# Patient Record
Sex: Male | Born: 1995 | Marital: Single | Smoking: Current some day smoker
Health system: Southern US, Community
[De-identification: ages and names within clinical notes are randomized; demographics above are authoritative.]

---
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
## 2015-08-04 ENCOUNTER — Encounter (HOSPITAL_COMMUNITY): Payer: Self-pay | Admitting: *Deleted

## 2015-08-04 ENCOUNTER — Emergency Department (HOSPITAL_COMMUNITY): Payer: BLUE CROSS/BLUE SHIELD

## 2015-08-04 ENCOUNTER — Observation Stay (HOSPITAL_COMMUNITY)
Admission: EM | Admit: 2015-08-04 | Discharge: 2015-08-05 | Disposition: A | Discharge status: Home / Self Care | Payer: BLUE CROSS/BLUE SHIELD | Attending: General Surgery | Admitting: General Surgery

## 2015-08-04 DIAGNOSIS — S060X9A Concussion with loss of consciousness of unspecified duration, initial encounter: Secondary | ICD-10-CM | POA: Diagnosis not present

## 2015-08-04 DIAGNOSIS — S060XAA Concussion with loss of consciousness status unknown, initial encounter: Secondary | ICD-10-CM | POA: Diagnosis present

## 2015-08-04 DIAGNOSIS — R918 Other nonspecific abnormal finding of lung field: Secondary | ICD-10-CM | POA: Diagnosis not present

## 2015-08-04 DIAGNOSIS — F17200 Nicotine dependence, unspecified, uncomplicated: Secondary | ICD-10-CM | POA: Diagnosis not present

## 2015-08-04 DIAGNOSIS — F10920 Alcohol use, unspecified with intoxication, uncomplicated: Secondary | ICD-10-CM

## 2015-08-04 DIAGNOSIS — F10129 Alcohol abuse with intoxication, unspecified: Secondary | ICD-10-CM | POA: Diagnosis not present

## 2015-08-04 DIAGNOSIS — S0081XA Abrasion of other part of head, initial encounter: Secondary | ICD-10-CM | POA: Diagnosis not present

## 2015-08-04 DIAGNOSIS — M542 Cervicalgia: Secondary | ICD-10-CM | POA: Diagnosis present

## 2015-08-04 DIAGNOSIS — Y92410 Unspecified street and highway as the place of occurrence of the external cause: Secondary | ICD-10-CM | POA: Insufficient documentation

## 2015-08-04 DIAGNOSIS — R102 Pelvic and perineal pain: Secondary | ICD-10-CM | POA: Diagnosis not present

## 2015-08-04 DIAGNOSIS — S27329A Contusion of lung, unspecified, initial encounter: Secondary | ICD-10-CM | POA: Diagnosis present

## 2015-08-04 DIAGNOSIS — F10929 Alcohol use, unspecified with intoxication, unspecified: Secondary | ICD-10-CM | POA: Diagnosis present

## 2015-08-04 DIAGNOSIS — R402431 Glasgow coma scale score 3-8, in the field [EMT or ambulance]: Secondary | ICD-10-CM | POA: Diagnosis not present

## 2015-08-04 DIAGNOSIS — S060X0A Concussion without loss of consciousness, initial encounter: Secondary | ICD-10-CM

## 2015-08-04 LAB — URINALYSIS, ROUTINE W REFLEX MICROSCOPIC
Bilirubin Urine: NEGATIVE
GLUCOSE, UA: NEGATIVE mg/dL
Ketones, ur: NEGATIVE mg/dL
LEUKOCYTES UA: NEGATIVE
Nitrite: NEGATIVE
PROTEIN: NEGATIVE mg/dL
Specific Gravity, Urine: 1.008 (ref 1.005–1.030)
pH: 6 (ref 5.0–8.0)

## 2015-08-04 LAB — I-STAT CHEM 8, ED
BUN: 11 mg/dL (ref 6–20)
CALCIUM ION: 1 mmol/L — AB (ref 1.12–1.23)
CHLORIDE: 108 mmol/L (ref 101–111)
CREATININE: 0.9 mg/dL (ref 0.61–1.24)
GLUCOSE: 81 mg/dL (ref 65–99)
HCT: 51 % (ref 39.0–52.0)
Hemoglobin: 17.3 g/dL — ABNORMAL HIGH (ref 13.0–17.0)
Potassium: 3.8 mmol/L (ref 3.5–5.1)
Sodium: 145 mmol/L (ref 135–145)
TCO2: 20 mmol/L (ref 0–100)

## 2015-08-04 LAB — CBC
HCT: 47.1 % (ref 39.0–52.0)
HEMOGLOBIN: 15.1 g/dL (ref 13.0–17.0)
MCH: 29.5 pg (ref 26.0–34.0)
MCHC: 32.1 g/dL (ref 30.0–36.0)
MCV: 92.2 fL (ref 78.0–100.0)
PLATELETS: 176 10*3/uL (ref 150–400)
RBC: 5.11 MIL/uL (ref 4.22–5.81)
RDW: 12.8 % (ref 11.5–15.5)
WBC: 8.5 10*3/uL (ref 4.0–10.5)

## 2015-08-04 LAB — TYPE AND SCREEN
ABO/RH(D): B POS
ANTIBODY SCREEN: NEGATIVE
UNIT DIVISION: 0
Unit division: 0

## 2015-08-04 LAB — COMPREHENSIVE METABOLIC PANEL
ALK PHOS: 74 U/L (ref 38–126)
ALT: 17 U/L (ref 17–63)
ANION GAP: 15 (ref 5–15)
AST: 32 U/L (ref 15–41)
Albumin: 4.4 g/dL (ref 3.5–5.0)
BILIRUBIN TOTAL: 0.5 mg/dL (ref 0.3–1.2)
BUN: 10 mg/dL (ref 6–20)
CALCIUM: 8.8 mg/dL — AB (ref 8.9–10.3)
CO2: 17 mmol/L — ABNORMAL LOW (ref 22–32)
Chloride: 112 mmol/L — ABNORMAL HIGH (ref 101–111)
Creatinine, Ser: 0.7 mg/dL (ref 0.61–1.24)
Glucose, Bld: 82 mg/dL (ref 65–99)
Potassium: 4 mmol/L (ref 3.5–5.1)
Sodium: 144 mmol/L (ref 135–145)
TOTAL PROTEIN: 7.1 g/dL (ref 6.5–8.1)

## 2015-08-04 LAB — RAPID URINE DRUG SCREEN, HOSP PERFORMED
AMPHETAMINES: NOT DETECTED
BENZODIAZEPINES: NOT DETECTED
Barbiturates: NOT DETECTED
COCAINE: NOT DETECTED
Opiates: NOT DETECTED
Tetrahydrocannabinol: NOT DETECTED

## 2015-08-04 LAB — I-STAT CG4 LACTIC ACID, ED: Lactic Acid, Venous: 2.4 mmol/L (ref 0.5–2.0)

## 2015-08-04 LAB — PREPARE FRESH FROZEN PLASMA
Unit division: 0
Unit division: 0

## 2015-08-04 LAB — CDS SEROLOGY

## 2015-08-04 LAB — ABO/RH: ABO/RH(D): B POS

## 2015-08-04 LAB — URINE MICROSCOPIC-ADD ON
BACTERIA UA: NONE SEEN
SQUAMOUS EPITHELIAL / LPF: NONE SEEN

## 2015-08-04 LAB — PROTIME-INR
INR: 1.01 (ref 0.00–1.49)
PROTHROMBIN TIME: 13.5 s (ref 11.6–15.2)

## 2015-08-04 LAB — ETHANOL: ALCOHOL ETHYL (B): 209 mg/dL — AB (ref <5)

## 2015-08-04 MED ORDER — ENOXAPARIN SODIUM 40 MG/0.4ML ~~LOC~~ SOLN
40.0000 mg | SUBCUTANEOUS | Status: DC
  Administered 2015-08-04: 40 mg via SUBCUTANEOUS
  Filled 2015-08-04 (×2): qty 0.4

## 2015-08-04 MED ORDER — SODIUM CHLORIDE 0.9% FLUSH: 3.0000 mL | INTRAVENOUS | Status: DC | PRN

## 2015-08-04 MED ORDER — ONDANSETRON HCL 4 MG/2ML IJ SOLN
4.0000 mg | Freq: Once | INTRAMUSCULAR | Status: AC
Start: 2015-08-04 — End: 2015-08-04
  Administered 2015-08-04: 4 mg via INTRAVENOUS

## 2015-08-04 MED ORDER — ONDANSETRON HCL 4 MG/2ML IJ SOLN: 4.0000 mg | Freq: Four times a day (QID) | INTRAMUSCULAR | Status: DC | PRN

## 2015-08-04 MED ORDER — TRAMADOL HCL 50 MG PO TABS
50.0000 mg | ORAL_TABLET | Freq: Four times a day (QID) | ORAL | Status: DC | PRN
  Administered 2015-08-04 (×2): 50 mg via ORAL
  Administered 2015-08-05 (×2): 100 mg via ORAL
  Filled 2015-08-04: qty 2
  Filled 2015-08-04 (×2): qty 1
  Filled 2015-08-04: qty 2

## 2015-08-04 MED ORDER — ONDANSETRON HCL 4 MG PO TABS: 4.0000 mg | ORAL_TABLET | Freq: Four times a day (QID) | ORAL | Status: DC | PRN

## 2015-08-04 MED ORDER — IOPAMIDOL (ISOVUE-300) INJECTION 61%
INTRAVENOUS | Status: AC
  Filled 2015-08-04: qty 100

## 2015-08-04 MED ORDER — MORPHINE SULFATE (PF) 2 MG/ML IV SOLN
2.0000 mg | INTRAVENOUS | Status: DC | PRN
Start: 2015-08-04 — End: 2015-08-05
  Administered 2015-08-04: 2 mg via INTRAVENOUS
  Filled 2015-08-04: qty 1

## 2015-08-04 MED ORDER — ONDANSETRON HCL 4 MG/2ML IJ SOLN
INTRAMUSCULAR | Status: AC
  Filled 2015-08-04: qty 2

## 2015-08-04 MED ORDER — BACITRACIN ZINC 500 UNIT/GM EX OINT
TOPICAL_OINTMENT | Freq: Two times a day (BID) | CUTANEOUS | Status: DC
  Administered 2015-08-04: 17:00:00 via TOPICAL
  Administered 2015-08-04: 1 via TOPICAL
  Administered 2015-08-05: 09:00:00 via TOPICAL
  Filled 2015-08-04: qty 28.35

## 2015-08-04 MED ORDER — SODIUM CHLORIDE 0.9% FLUSH
3.0000 mL | Freq: Two times a day (BID) | INTRAVENOUS | Status: DC
  Administered 2015-08-04: 3 mL via INTRAVENOUS

## 2015-08-04 MED ORDER — SODIUM CHLORIDE 0.9 % IV BOLUS (SEPSIS): 1000.0000 mL | Freq: Once | INTRAVENOUS | Status: DC

## 2015-08-04 MED ORDER — SODIUM CHLORIDE 0.9 % IV SOLN: 250.0000 mL | INTRAVENOUS | Status: DC | PRN
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
## 2015-08-04 NOTE — ED Notes (Signed)
Report called to 5m 

## 2015-08-04 NOTE — ED Notes (Signed)
Pt transported to CT with RN Craig Guess.
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
## 2015-08-04 NOTE — ED Notes (Signed)
Chaplain in dept. Going to meet with family now.
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
## 2015-08-04 NOTE — ED Notes (Signed)
Blood arrived @ 0847. Handed to [employer] M, RN.

## 2015-08-04 NOTE — ED Notes (Signed)
Chaplain paged @ [phone].
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
## 2015-08-04 NOTE — Progress Notes (Signed)
PT Cancellation Note  Patient Details Name: Charles Haas MRN: [id number] DOB: [birth year]   Cancelled Treatment:    Reason Eval/Treat Not Completed: Fatigue/lethargy limiting ability to participate; RN reports pt solement and difficult to rouse.  Will attempt to see in AM.   Reginia Naas 08/04/2015, 2:08 PM  Magda Kiel, [place] 08/04/2015

## 2015-08-04 NOTE — Progress Notes (Signed)
Patient arrived to 5M15 noted somnolent, able to be arouse with pain and can to follow commands. Family at bedside. Safety precautions and orders reviewed with patient/family. VSS. He denied any pain or discomfort. No other distress noted. Will continue to monitor.  Ave Filter, RN

## 2015-08-05 DIAGNOSIS — F10929 Alcohol use, unspecified with intoxication, unspecified: Secondary | ICD-10-CM | POA: Diagnosis present

## 2015-08-05 LAB — BLOOD PRODUCT ORDER (VERBAL) VERIFICATION

## 2015-08-05 MED ORDER — TRAMADOL HCL 50 MG PO TABS: 50.0000 mg | ORAL_TABLET | Freq: Four times a day (QID) | ORAL | Status: AC | PRN
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
## 2015-08-05 NOTE — Progress Notes (Signed)
Discharge instructions reviewed with patient/family. All questions answered at this time. RX given. Pt request to have pain medication before discharge, med given. Transport home by father. All belongings sent with patient/family.    Ave Filter, RN

## 2015-08-05 NOTE — Discharge Instructions (Signed)
No driving until symptom-free for 3 days (i.e. No headaches, dizziness, nausea, or visual disturbances)

## 2015-08-08 ENCOUNTER — Encounter (HOSPITAL_BASED_OUTPATIENT_CLINIC_OR_DEPARTMENT_OTHER): Payer: Self-pay | Admitting: General Surgery

## 2018-01-20 ENCOUNTER — Other Ambulatory Visit: Payer: Self-pay

## 2018-01-20 ENCOUNTER — Emergency Department: Payer: BLUE CROSS/BLUE SHIELD

## 2018-01-20 ENCOUNTER — Emergency Department
Admission: EM | Admit: 2018-01-20 | Discharge: 2018-01-20 | Disposition: A | Discharge status: Home / Self Care | Payer: BLUE CROSS/BLUE SHIELD | Attending: Emergency Medicine | Admitting: Emergency Medicine

## 2018-01-20 ENCOUNTER — Encounter: Payer: Self-pay | Admitting: Emergency Medicine

## 2018-01-20 DIAGNOSIS — F17210 Nicotine dependence, cigarettes, uncomplicated: Secondary | ICD-10-CM | POA: Diagnosis not present

## 2018-01-20 DIAGNOSIS — R1010 Upper abdominal pain, unspecified: Secondary | ICD-10-CM | POA: Diagnosis not present

## 2018-01-20 DIAGNOSIS — A0472 Enterocolitis due to Clostridium difficile, not specified as recurrent: Secondary | ICD-10-CM | POA: Insufficient documentation

## 2018-01-20 DIAGNOSIS — Z79899 Other long term (current) drug therapy: Secondary | ICD-10-CM | POA: Insufficient documentation

## 2018-01-20 LAB — GASTROINTESTINAL PANEL BY PCR, STOOL (REPLACES STOOL CULTURE)
ASTROVIRUS: NOT DETECTED
Adenovirus F40/41: NOT DETECTED
CAMPYLOBACTER SPECIES: NOT DETECTED
CRYPTOSPORIDIUM: NOT DETECTED
CYCLOSPORA CAYETANENSIS: NOT DETECTED
ENTEROPATHOGENIC E COLI (EPEC): NOT DETECTED
ENTEROTOXIGENIC E COLI (ETEC): NOT DETECTED
Entamoeba histolytica: NOT DETECTED
Enteroaggregative E coli (EAEC): NOT DETECTED
Giardia lamblia: NOT DETECTED
Norovirus GI/GII: DETECTED — AB
PLESIMONAS SHIGELLOIDES: NOT DETECTED
ROTAVIRUS A: NOT DETECTED
SHIGA LIKE TOXIN PRODUCING E COLI (STEC): NOT DETECTED
Salmonella species: NOT DETECTED
Sapovirus (I, II, IV, and V): NOT DETECTED
Shigella/Enteroinvasive E coli (EIEC): NOT DETECTED
VIBRIO SPECIES: NOT DETECTED
Vibrio cholerae: NOT DETECTED
Yersinia enterocolitica: NOT DETECTED

## 2018-01-20 LAB — CBC
HCT: 50.5 % (ref 39.0–52.0)
Hemoglobin: 16.4 g/dL (ref 13.0–17.0)
MCH: 29.3 pg (ref 26.0–34.0)
MCHC: 32.5 g/dL (ref 30.0–36.0)
MCV: 90.2 fL (ref 80.0–100.0)
Platelets: 278 10*3/uL (ref 150–400)
RBC: 5.6 MIL/uL (ref 4.22–5.81)
RDW: 12.4 % (ref 11.5–15.5)
WBC: 16.8 10*3/uL — AB (ref 4.0–10.5)
nRBC: 0 % (ref 0.0–0.2)

## 2018-01-20 LAB — URINALYSIS, COMPLETE (UACMP) WITH MICROSCOPIC
Bacteria, UA: NONE SEEN
Bilirubin Urine: NEGATIVE
Glucose, UA: NEGATIVE mg/dL
Ketones, ur: 5 mg/dL — AB
Leukocytes, UA: NEGATIVE
NITRITE: NEGATIVE
Protein, ur: NEGATIVE mg/dL
Specific Gravity, Urine: 1.029 (ref 1.005–1.030)
Squamous Epithelial / LPF: NONE SEEN (ref 0–5)
pH: 5 (ref 5.0–8.0)

## 2018-01-20 LAB — COMPREHENSIVE METABOLIC PANEL
ALK PHOS: 67 U/L (ref 38–126)
ALT: 18 U/L (ref 0–44)
AST: 23 U/L (ref 15–41)
Albumin: 4.8 g/dL (ref 3.5–5.0)
Anion gap: 9 (ref 5–15)
BILIRUBIN TOTAL: 0.6 mg/dL (ref 0.3–1.2)
BUN: 17 mg/dL (ref 6–20)
CALCIUM: 9.2 mg/dL (ref 8.9–10.3)
CO2: 25 mmol/L (ref 22–32)
Chloride: 106 mmol/L (ref 98–111)
Creatinine, Ser: 0.81 mg/dL (ref 0.61–1.24)
GFR calc Af Amer: >60 mL/min (ref >60)
Glucose, Bld: 99 mg/dL (ref 70–99)
Potassium: 4.4 mmol/L (ref 3.5–5.1)
Sodium: 140 mmol/L (ref 135–145)
TOTAL PROTEIN: 7.9 g/dL (ref 6.5–8.1)

## 2018-01-20 LAB — LIPASE, BLOOD: Lipase: 42 U/L (ref 11–51)

## 2018-01-20 LAB — C DIFFICILE QUICK SCREEN W PCR REFLEX
C DIFFICLE (CDIFF) ANTIGEN: POSITIVE — AB
C Diff toxin: NEGATIVE

## 2018-01-20 LAB — CLOSTRIDIUM DIFFICILE BY PCR, REFLEXED: Toxigenic C. Difficile by PCR: NEGATIVE

## 2018-01-20 MED ORDER — METRONIDAZOLE 500 MG PO TABS
500.0000 mg | ORAL_TABLET | Freq: Once | ORAL | Status: AC
  Administered 2018-01-20: 500 mg via ORAL
  Filled 2018-01-20: qty 1

## 2018-01-20 MED ORDER — ONDANSETRON HCL 4 MG PO TABS: 4.0000 mg | ORAL_TABLET | Freq: Every day | ORAL | 0 refills | Status: AC | PRN

## 2018-01-20 MED ORDER — SODIUM CHLORIDE 0.9 % IV BOLUS
1000.0000 mL | Freq: Once | INTRAVENOUS | Status: AC
  Administered 2018-01-20: 1000 mL via INTRAVENOUS

## 2018-01-20 MED ORDER — ONDANSETRON HCL 4 MG/2ML IJ SOLN
4.0000 mg | Freq: Once | INTRAMUSCULAR | Status: AC
  Administered 2018-01-20: 4 mg via INTRAVENOUS
  Filled 2018-01-20: qty 2

## 2018-01-20 MED ORDER — IOPAMIDOL (ISOVUE-300) INJECTION 61%
30.0000 mL | Freq: Once | INTRAVENOUS | Status: AC | PRN
  Administered 2018-01-20: 30 mL via ORAL
  Filled 2018-01-20: qty 30

## 2018-01-20 MED ORDER — METRONIDAZOLE 500 MG PO TABS: 500.0000 mg | ORAL_TABLET | Freq: Three times a day (TID) | ORAL | 0 refills | Status: AC

## 2018-01-20 MED ORDER — IOPAMIDOL (ISOVUE-300) INJECTION 61%
100.0000 mL | Freq: Once | INTRAVENOUS | Status: AC | PRN
  Administered 2018-01-20: 100 mL via INTRAVENOUS
  Filled 2018-01-20: qty 100

## 2018-01-20 NOTE — ED Triage Notes (Addendum)
Pt arrived via POV with reports of upper  abdominal pain for the past couple of weeks, states the pain feels like bloating and discomfort.  Pt denies any vomiting, but does report some diarrhea and nausea.  Pt has been on omnicef for URI and has not yet completed regimen.    Pt reports some pain with urination as well-burning.  Pt reports stool has been dark green liquid. Pt reports 2 episodes of diarrhea today.

## 2018-01-20 NOTE — ED Notes (Signed)
Pt called in the WR with no response 

## 2018-01-20 NOTE — ED Notes (Signed)
Pt ambulatory to POV without difficulty. VSS. NAD. Discharge instructions, RX and follow up reviewed. All questions and concerns addressed.  
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
## 2018-01-21 ENCOUNTER — Telehealth: Payer: Self-pay | Admitting: Emergency Medicine

## 2018-01-21 NOTE — Telephone Encounter (Addendum)
Called patient to inform of gi panel result.  Reviewed by dr Corky Downs and wants patient instructed to stop the flagyl as he feels he has the norovirus and not c diff at this time.  Called [facility] phone and left message.  Called mobile and there is not an answer.   01/22/2018--I was able to talk to patient today.  Instructed as above.  Says his girlfriend also has diarrhea and has been to her doctor.  Instructed to have her tell her doctor about his norovirus result as well as the 60 month old in the house who also has diarrhea.  Says the baby has appt at 11am and they will inform the doctor.

## 2019-08-14 ENCOUNTER — Emergency Department: Payer: BC Managed Care – PPO

## 2019-08-14 ENCOUNTER — Other Ambulatory Visit: Payer: Self-pay

## 2019-08-14 ENCOUNTER — Emergency Department
Admission: EM | Admit: 2019-08-14 | Discharge: 2019-08-14 | Disposition: A | Discharge status: Home / Self Care | Payer: BC Managed Care – PPO | Attending: Emergency Medicine | Admitting: Emergency Medicine

## 2019-08-14 DIAGNOSIS — F17210 Nicotine dependence, cigarettes, uncomplicated: Secondary | ICD-10-CM | POA: Insufficient documentation

## 2019-08-14 DIAGNOSIS — R1031 Right lower quadrant pain: Secondary | ICD-10-CM | POA: Diagnosis present

## 2019-08-14 DIAGNOSIS — Z79899 Other long term (current) drug therapy: Secondary | ICD-10-CM | POA: Insufficient documentation

## 2019-08-14 DIAGNOSIS — N50811 Right testicular pain: Secondary | ICD-10-CM | POA: Diagnosis not present

## 2019-08-14 DIAGNOSIS — R350 Frequency of micturition: Secondary | ICD-10-CM | POA: Diagnosis not present

## 2019-08-14 LAB — COMPREHENSIVE METABOLIC PANEL
ALT: 21 U/L (ref 0–44)
AST: 28 U/L (ref 15–41)
Albumin: 4.6 g/dL (ref 3.5–5.0)
Alkaline Phosphatase: 65 U/L (ref 38–126)
Anion gap: 9 (ref 5–15)
BUN: 18 mg/dL (ref 6–20)
CO2: 25 mmol/L (ref 22–32)
Calcium: 8.9 mg/dL (ref 8.9–10.3)
Chloride: 102 mmol/L (ref 98–111)
Creatinine, Ser: 0.69 mg/dL (ref 0.61–1.24)
GFR calc Af Amer: >60 mL/min (ref >60)
GFR calc non Af Amer: >60 mL/min (ref >60)
Glucose, Bld: 128 mg/dL — ABNORMAL HIGH (ref 70–99)
Potassium: 3.9 mmol/L (ref 3.5–5.1)
Sodium: 136 mmol/L (ref 135–145)
Total Bilirubin: 0.4 mg/dL (ref 0.3–1.2)
Total Protein: 7.5 g/dL (ref 6.5–8.1)

## 2019-08-14 LAB — LIPASE, BLOOD: Lipase: 55 U/L — ABNORMAL HIGH (ref 11–51)

## 2019-08-14 LAB — CBC
HCT: 41.1 % (ref 39.0–52.0)
Hemoglobin: 13.9 g/dL (ref 13.0–17.0)
MCH: 30 pg (ref 26.0–34.0)
MCHC: 33.8 g/dL (ref 30.0–36.0)
MCV: 88.8 fL (ref 80.0–100.0)
Platelets: 253 10*3/uL (ref 150–400)
RBC: 4.63 MIL/uL (ref 4.22–5.81)
RDW: 11.9 % (ref 11.5–15.5)
WBC: 8.7 10*3/uL (ref 4.0–10.5)
nRBC: 0 % (ref 0.0–0.2)

## 2019-08-14 LAB — URINALYSIS, COMPLETE (UACMP) WITH MICROSCOPIC
Bacteria, UA: NONE SEEN
Bilirubin Urine: NEGATIVE
Glucose, UA: NEGATIVE mg/dL
Ketones, ur: NEGATIVE mg/dL
Leukocytes,Ua: NEGATIVE
Nitrite: NEGATIVE
Protein, ur: NEGATIVE mg/dL
Specific Gravity, Urine: 1.018 (ref 1.005–1.030)
Squamous Epithelial / LPF: NONE SEEN (ref 0–5)
pH: 5 (ref 5.0–8.0)

## 2019-08-14 MED ORDER — KETOROLAC TROMETHAMINE 30 MG/ML IJ SOLN
15.0000 mg | INTRAMUSCULAR | Status: AC
  Administered 2019-08-14: 15 mg via INTRAVENOUS
  Filled 2019-08-14: qty 1

## 2019-08-14 MED ORDER — SODIUM CHLORIDE 0.9 % IV BOLUS
1000.0000 mL | Freq: Once | INTRAVENOUS | Status: AC
  Administered 2019-08-14: 1000 mL via INTRAVENOUS

## 2019-08-14 MED ORDER — IOHEXOL 300 MG/ML  SOLN
100.0000 mL | Freq: Once | INTRAMUSCULAR | Status: AC | PRN
  Administered 2019-08-14: 100 mL via INTRAVENOUS

## 2019-08-14 NOTE — ED Triage Notes (Signed)
RLQ pain and right testicle pain, urinary frequency since Monday.
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
## 2019-08-14 NOTE — Discharge Instructions (Signed)
Your labs and ultrasound today were unremarkable.  Your CT scan shows some mild inflammation of the spermatic cord of the right testicle.  We discussed this finding with urology who recommend anti-inflammatory medication such as ibuprofen or naproxen and supportive underwear. If symptoms do not resolve in the next few days, please call the urology clinic for a follow up appointment.

## 2020-01-07 ENCOUNTER — Ambulatory Visit
Admission: RE | Admit: 2020-01-07 | Discharge: 2020-01-07 | Disposition: A | Discharge status: Home / Self Care | Payer: BC Managed Care – PPO | Source: Ambulatory Visit | Attending: Family Medicine | Admitting: Family Medicine

## 2020-01-07 VITALS — BP 117/75 | HR 90 | Temp 99.4°F | Resp 18

## 2020-01-07 DIAGNOSIS — J209 Acute bronchitis, unspecified: Secondary | ICD-10-CM

## 2020-01-07 DIAGNOSIS — J069 Acute upper respiratory infection, unspecified: Secondary | ICD-10-CM | POA: Diagnosis not present

## 2020-01-07 MED ORDER — AZITHROMYCIN 250 MG PO TABS
250.0000 mg | ORAL_TABLET | Freq: Every day | ORAL | 0 refills | Status: AC
Start: 2020-01-07 — End: ?

## 2020-01-07 MED ORDER — BENZONATATE 100 MG PO CAPS: 100.0000 mg | ORAL_CAPSULE | Freq: Three times a day (TID) | ORAL | 0 refills | Status: AC

## 2020-01-07 MED ORDER — PREDNISONE 10 MG (21) PO TBPK: ORAL_TABLET | Freq: Every day | ORAL | 0 refills | Status: AC

## 2020-01-07 MED ORDER — ALBUTEROL SULFATE HFA 108 (90 BASE) MCG/ACT IN AERS
2.0000 | INHALATION_SPRAY | RESPIRATORY_TRACT | 0 refills | Status: AC | PRN
Start: 2020-01-07 — End: ?

## 2020-01-07 NOTE — Discharge Instructions (Signed)
I have sent in a prednisone taper for you to take for 6 days. 6 tablets on day one, 5 tablets on day two, 4 tablets on day three, 3 tablets on day four, 2 tablets on day five, and 1 tablet on day six.  I have sent in azithromycin for you to take.  Take 2 tablets today, and then take 1 tablet daily for the next 4 days.  I have sent in an albuterol inhaler for you to use 2 puffs every 4-6 hours as needed for wheezing, shortness of breath, cough.  I have sent in [facility] for you to use 1 capsule every 8 hours as needed for cough.  Follow-up with this office or with primary care if symptoms or not improving by Monday.  Follow-up with the ER for trouble swallowing, high fever, trouble breathing, other concerning symptoms

## 2020-01-07 NOTE — ED Triage Notes (Signed)
Pt presents with productive cough x 1 month.  Green-yellow mucous.  Denies fevers, SOB.   No other symptoms.  Was initially using Dayquil and Robitussin.  Had some improvement but cough worsened yesterday.  Chest hurts when he coughs.

## 2020-01-07 NOTE — ED Provider Notes (Signed)
[place]   [id number] 01/07/20 Arrival Time: 1509   SUBJECTIVE:  Charles Haas is a 24 y.o. male who presents with complaint of nasal congestion, post-nasal drainage, and a persistent cough. Onset abrupt approximately 4 weeks ago. Overall fatigued. SOB: mild. Wheezing: moderate. Has negative history of Covid. Has not completed Covid vaccines. OTC treatment: none. Current daily smoker. Social History   Tobacco Use  Smoking Status Current Some Day Smoker  . Packs/day: 0.25  . Types: Cigarettes  Smokeless Tobacco Never Used    ROS: As per HPI.   OBJECTIVE:  Vitals:   01/07/20 1523  BP: 117/75  Pulse: 90  Resp: 18  Temp: 99.4 F (37.4 C)  TempSrc: Oral  SpO2: 97%     General appearance: alert; no distress HEENT: nasal congestion; clear runny nose; throat irritation secondary to post-nasal drainage Neck: supple without LAD Lungs: wheezing to bilateral lower lobes Skin: warm and dry Psychological: alert and cooperative; normal mood and affect  Results for orders placed or performed during the hospital encounter of 08/14/19  Lipase, blood  Result Value Ref Range   Lipase 55 (H) 11 - 51 U/L  Comprehensive metabolic panel  Result Value Ref Range   Sodium 136 135 - 145 mmol/L   Potassium 3.9 3.5 - 5.1 mmol/L   Chloride 102 98 - 111 mmol/L   CO2 25 22 - 32 mmol/L   Glucose, Bld 128 (H) 70 - 99 mg/dL   BUN 18 6 - 20 mg/dL   Creatinine, Ser 0.69 0.61 - 1.24 mg/dL   Calcium 8.9 8.9 - 10.3 mg/dL   Total Protein 7.5 6.5 - 8.1 g/dL   Albumin 4.6 3.5 - 5.0 g/dL   AST 28 15 - 41 U/L   ALT 21 0 - 44 U/L   Alkaline Phosphatase 65 38 - 126 U/L   Total Bilirubin 0.4 0.3 - 1.2 mg/dL   GFR calc non Af Amer >60 >60 mL/min   GFR calc Af Amer >60 >60 mL/min   Anion gap 9 5 - 15  CBC  Result Value Ref Range   WBC 8.7 4.0 - 10.5 K/uL   RBC 4.63 4.22 - 5.81 MIL/uL   Hemoglobin 13.9 13.0 - 17.0 g/dL   HCT 41.1 39 - 52 %   MCV 88.8 80.0 - 100.0 fL   MCH 30.0 26.0 -  34.0 pg   MCHC 33.8 30.0 - 36.0 g/dL   RDW 11.9 11.5 - 15.5 %   Platelets 253 150 - 400 K/uL   nRBC 0.0 0.0 - 0.2 %  Urinalysis, Complete w Microscopic  Result Value Ref Range   Color, Urine YELLOW (A) YELLOW   APPearance CLEAR (A) CLEAR   Specific Gravity, Urine 1.018 1.005 - 1.030   pH 5.0 5.0 - 8.0   Glucose, UA NEGATIVE NEGATIVE mg/dL   Hgb urine dipstick SMALL (A) NEGATIVE   Bilirubin Urine NEGATIVE NEGATIVE   Ketones, ur NEGATIVE NEGATIVE mg/dL   Protein, ur NEGATIVE NEGATIVE mg/dL   Nitrite NEGATIVE NEGATIVE   Leukocytes,Ua NEGATIVE NEGATIVE   RBC / HPF 0-5 0 - 5 RBC/hpf   WBC, UA 0-5 0 - 5 WBC/hpf   Bacteria, UA NONE SEEN NONE SEEN   Squamous Epithelial / LPF NONE SEEN 0 - 5   Mucus PRESENT     Labs Reviewed - No data to display  No results found.  No Known Allergies  History reviewed. No pertinent past medical history. Social History   Socioeconomic History  .  Marital status: Single    Spouse name: Not on file  . Number of children: Not on file  . Years of education: Not on file  . Highest education level: Not on file  Occupational History  . Not on file  Tobacco Use  . Smoking status: Current Some Day Smoker    Packs/day: 0.25    Types: Cigarettes  . Smokeless tobacco: Never Used  Vaping Use  . Vaping Use: Some days  . Substances: Nicotine  Substance and Sexual Activity  . Alcohol use: Yes    Comment: occasional  . Drug use: No  . Sexual activity: Never  Other Topics Concern  . Not on file  Social History Narrative   ** Merged History Encounter **       Social Determinants of Health   Financial Resource Strain:   . Difficulty of Paying Living Expenses: Not on file  Food Insecurity:   . Worried About Charity fundraiser in the Last Year: Not on file  . Ran Out of Food in the Last Year: Not on file  Transportation Needs:   . Lack of Transportation (Medical): Not on file  . Lack of Transportation (Non-Medical): Not on file  Physical  Activity:   . Days of Exercise per Week: Not on file  . Minutes of Exercise per Session: Not on file  Stress:   . Feeling of Stress : Not on file  Social Connections:   . Frequency of Communication with Friends and Family: Not on file  . Frequency of Social Gatherings with Friends and Family: Not on file  . Attends Religious Services: Not on file  . Active Member of Clubs or Organizations: Not on file  . Attends Archivist Meetings: Not on file  . Marital Status: Not on file  Intimate Partner Violence:   . Fear of Current or Ex-Partner: Not on file  . Emotionally Abused: Not on file  . Physically Abused: Not on file  . Sexually Abused: Not on file   Family History  Problem Relation Age of Onset  . Hypertension Maternal Grandmother   . Heart disease Maternal Grandmother   . Cancer Maternal Grandmother   . Arthritis Maternal Grandmother   . Hypertension Maternal Grandfather   . Heart disease Maternal Grandfather   . Diabetes Maternal Grandfather   . Heart disease Paternal Grandmother   . Hypertension Paternal Grandmother   . Mental illness Paternal Grandmother   . COPD Paternal Grandmother   . Arthritis Paternal Grandmother   . Heart disease Paternal Grandfather   . Hypertension Paternal Grandfather   . Alcohol abuse Paternal Grandfather   . Healthy Mother   . Healthy Father      ASSESSMENT & PLAN:  1. Upper respiratory tract infection, unspecified type   2. Acute bronchitis, unspecified organism     Meds ordered this encounter  Medications  . azithromycin (ZITHROMAX) 250 MG tablet    Sig: Take 1 tablet (250 mg total) by mouth daily. Take first 2 tablets together, then 1 every day until finished.    Dispense:  6 tablet    Refill:  0    Order Specific Question:   Supervising Provider    Answer:   Chase Picket [id number]  . benzonatate (TESSALON) 100 MG capsule    Sig: Take 1 capsule (100 mg total) by mouth every 8 (eight) hours.    Dispense:  21  capsule    Refill:  0    Order Specific  Question:   Supervising Provider    Answer:   Chase Picket [id number]  . predniSONE (STERAPRED UNI-PAK 21 TAB) 10 MG (21) TBPK tablet    Sig: Take by mouth daily for 6 days. Take 6 tablets on day 1, 5 tablets on day 2, 4 tablets on day 3, 3 tablets on day 4, 2 tablets on day 5, 1 tablet on day 6    Dispense:  21 tablet    Refill:  0    Order Specific Question:   Supervising Provider    Answer:   Chase Picket [id number]  . albuterol (VENTOLIN HFA) 108 (90 Base) MCG/ACT inhaler    Sig: Inhale 2 puffs into the lungs every 4 (four) hours as needed for wheezing or shortness of breath.    Dispense:  18 g    Refill:  0    Order Specific Question:   Supervising Provider    Answer:   Chase Picket [id number]   Prescribed albuterol Prescribed steroid taper Prescribed tessalon perles Prescribed azithromycin Work note provided OTC symptom care as needed. Will plan f/u with PCP or here as needed.  Reviewed expectations re: course of current medical issues. Questions answered. Outlined signs and symptoms indicating need for more acute intervention. Patient verbalized understanding. After Visit Summary given.           Faustino Congress, NP 01/07/20 1547

## 2021-02-20 IMAGING — CT CT ABD-PELV W/ CM
2 of 4 series · 16 of 46 positions shown, 18 images · IV contrast (omnipaque)
Comparison: CT 01/20/2018

CLINICAL DATA: RIGHT lower quadrant pain. RIGHT testicular pain
urinary frequency. Normal white blood cell

EXAM:
CT ABDOMEN AND PELVIS WITH CONTRAST
TECHNIQUE: Multidetector CT imaging of the abdomen and pelvis was performed
using the standard protocol following bolus administration of
intravenous contrast.
CONTRAST:  100mL OMNIPAQUE IOHEXOL 300 MG/ML  SOLN

[Series 2: axial st · axial · 0.70mm/px · z∈[-541,-141]mm · 13 of 88 slices shown, 15 images]
[im 4/88  soft-tissue]
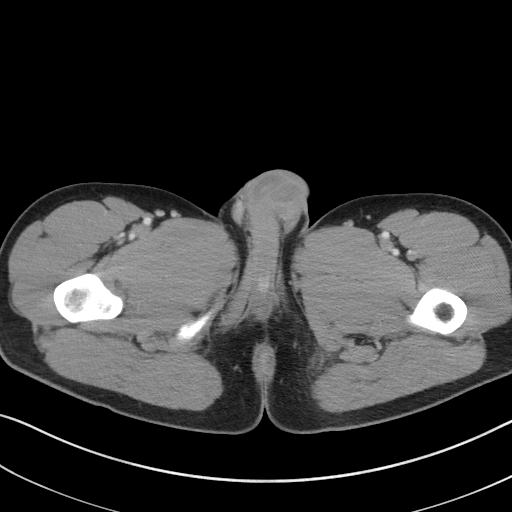
[im 4/88  bone]
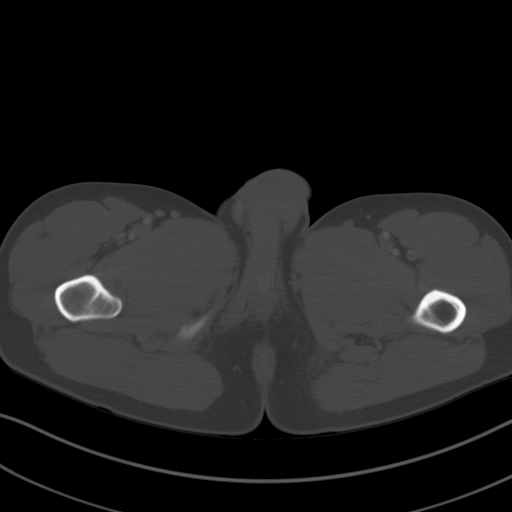
[im 11/88  soft-tissue]
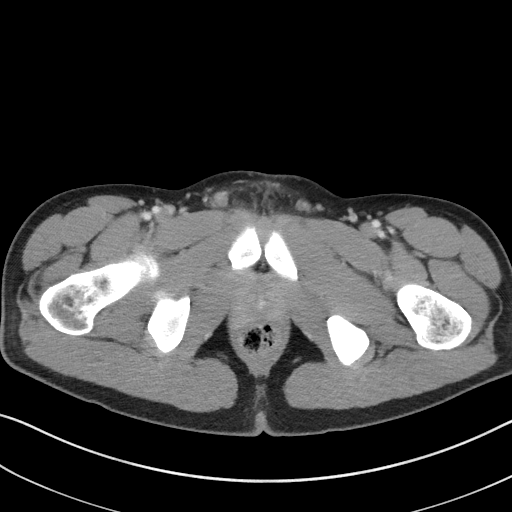
[im 18/88  soft-tissue]
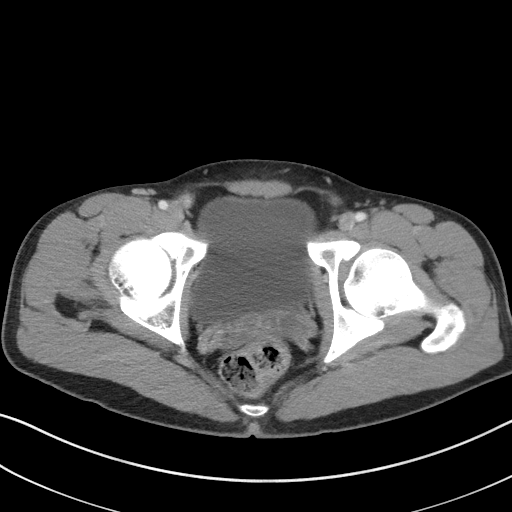
[im 25/88  soft-tissue]
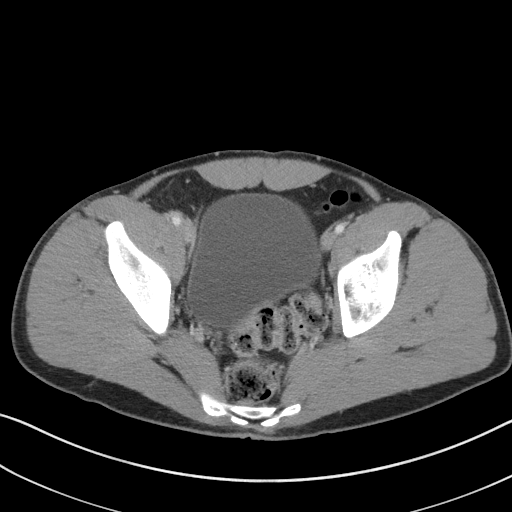
[im 32/88  soft-tissue]
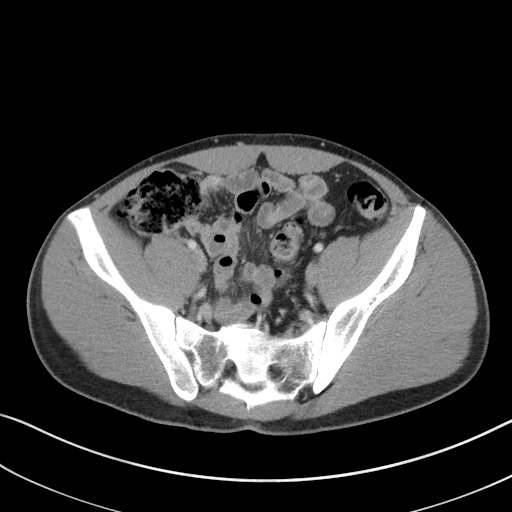
[im 39/88  soft-tissue]
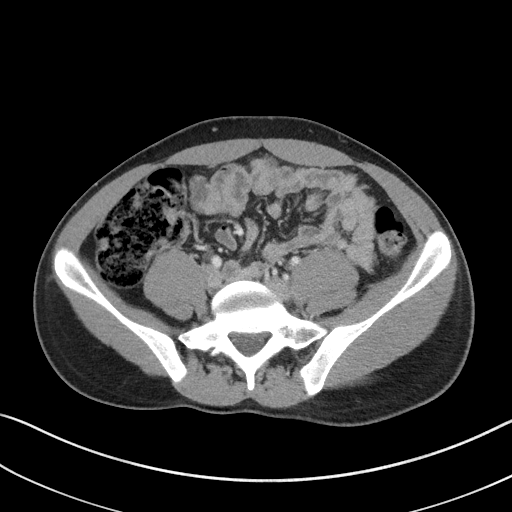
[im 46/88  soft-tissue]
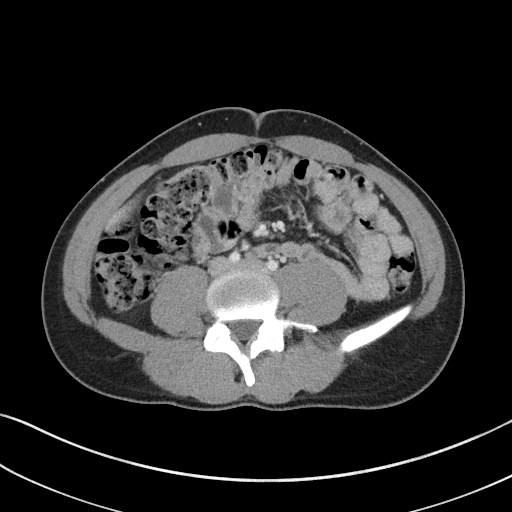
[im 49/88  soft-tissue]
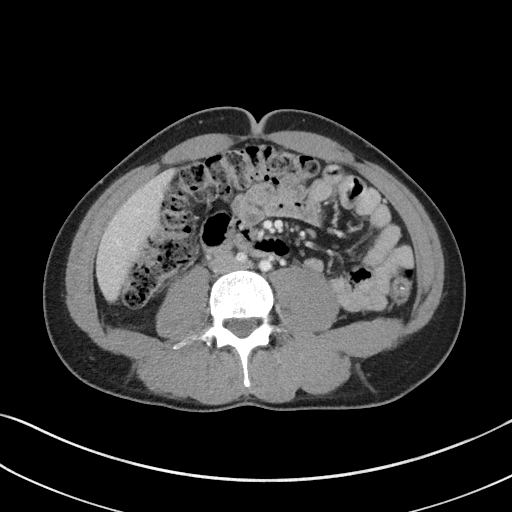
[im 56/88  soft-tissue]
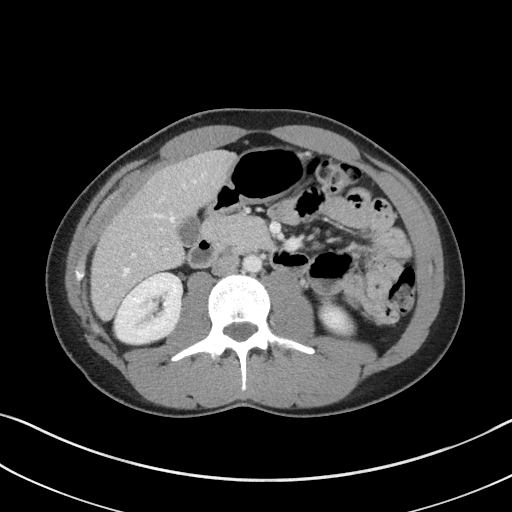
[im 56/88  bone]
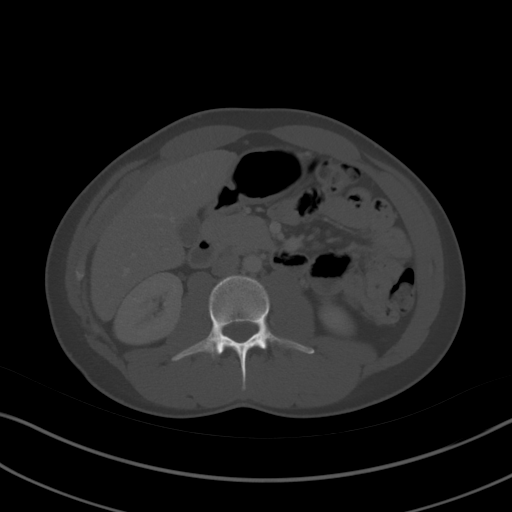
[im 63/88  soft-tissue]
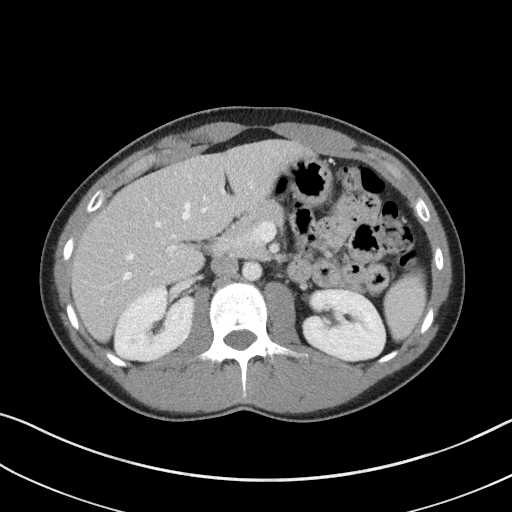
[im 70/88  soft-tissue]
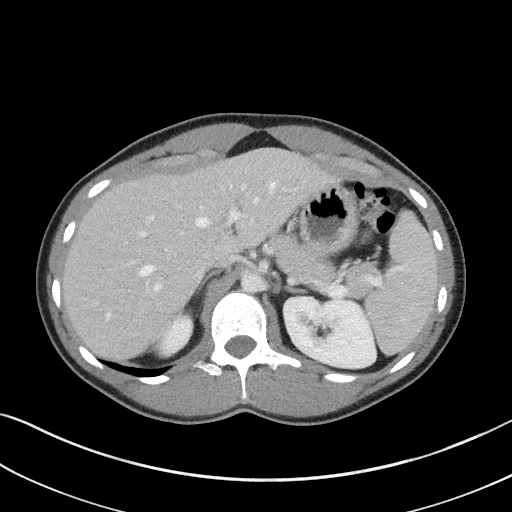
[im 77/88  soft-tissue]
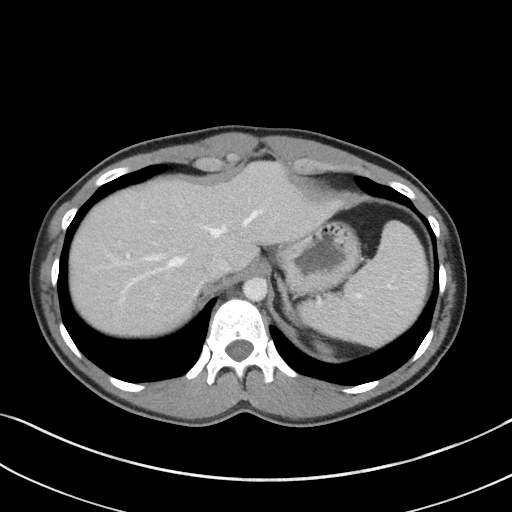
[im 84/88  soft-tissue]
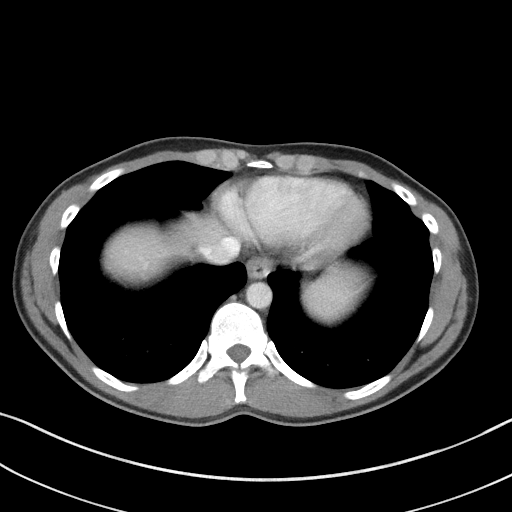

[Series 5: coronal st · coronal · 0.74mm/px · 3 of 79 slices shown]
[im 27/79  soft-tissue]
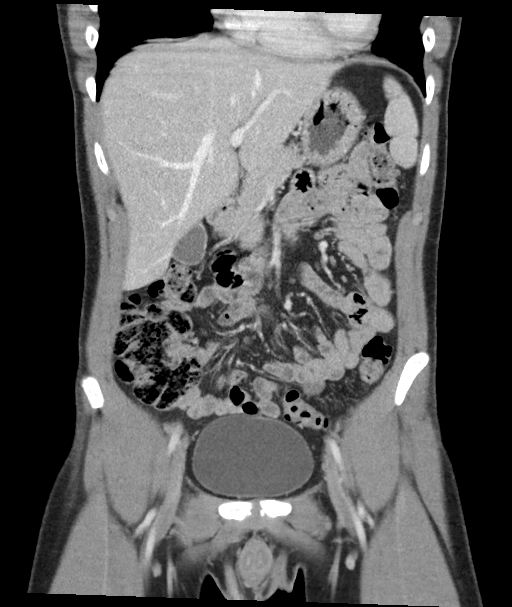
[im 35/79  soft-tissue]
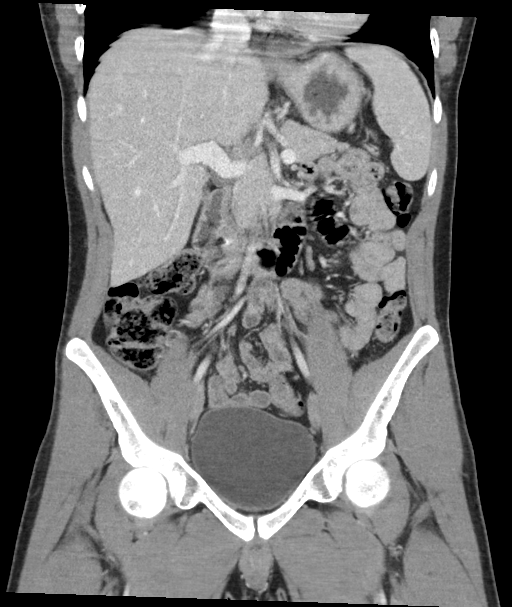
[im 44/79  soft-tissue]
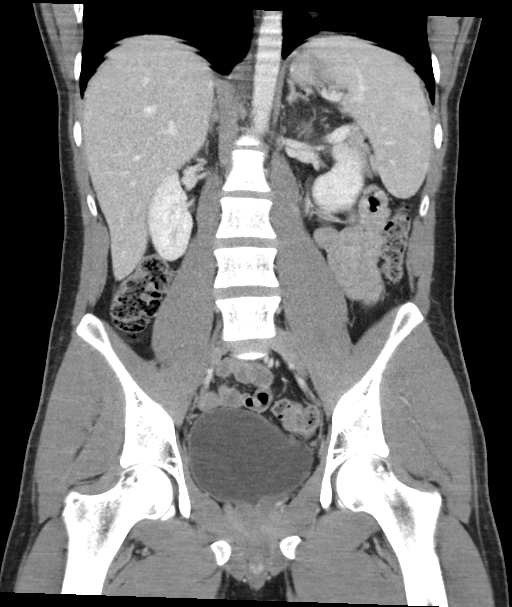

[16 of 46 positions shown; findings below may reference images not displayed]

FINDINGS: Lower chest: Lung bases are clear.

Hepatobiliary: No focal hepatic lesion. No biliary duct dilatation.
Gallbladder is normal. Common bile duct is normal.

Pancreas: Pancreas is normal. No ductal dilatation. No pancreatic
inflammation.

Spleen: Normal spleen

Adrenals/urinary tract: Adrenal glands and kidneys are normal. The
ureters and bladder normal.

Stomach/Bowel: Stomach, small-bowel and cecum are normal. The
appendix is not identified but there is no pericecal inflammation to
suggest appendicitis.

Vascular/Lymphatic: Abdominal aorta is normal caliber. No periportal
or retroperitoneal adenopathy. No pelvic adenopathy.

Reproductive: Prostate and seminal vesicles are normal. No inguinal
hernia.

There is mild inflammation along the RIGHT inguinal canal compared
to the LEFT (best seen on coronal image [DATE]). There is mild
thickening of the RIGHT spermatic cord/inguinal canal contents
compared to the RIGHT (9 mm compared to 6 mm). Coronal image [DATE])

Other: No free fluid.

Musculoskeletal: No aggressive osseous lesion.
IMPRESSION: 1. Mild inflammation along the RIGHT spermatic cord/inguinal canal.
No inguinal hernia.
2. Appendix not identified but no secondary signs appendicitis.
3. No obstructive uropathy.

## 2023-09-13 ENCOUNTER — Emergency Department

## 2023-09-13 ENCOUNTER — Other Ambulatory Visit: Payer: Self-pay

## 2023-09-13 ENCOUNTER — Emergency Department
Admission: EM | Admit: 2023-09-13 | Discharge: 2023-09-13 | Disposition: A | Discharge status: Home / Self Care | Attending: Emergency Medicine | Admitting: Emergency Medicine

## 2023-09-13 DIAGNOSIS — R1084 Generalized abdominal pain: Secondary | ICD-10-CM | POA: Insufficient documentation

## 2023-09-13 LAB — URINALYSIS, ROUTINE W REFLEX MICROSCOPIC
Bilirubin Urine: NEGATIVE
Glucose, UA: NEGATIVE mg/dL
Hgb urine dipstick: NEGATIVE
Ketones, ur: NEGATIVE mg/dL
Leukocytes,Ua: NEGATIVE
Nitrite: NEGATIVE
Protein, ur: NEGATIVE mg/dL
Specific Gravity, Urine: 1.002 — ABNORMAL LOW (ref 1.005–1.030)
pH: 7 (ref 5.0–8.0)

## 2023-09-13 LAB — COMPREHENSIVE METABOLIC PANEL WITH GFR
ALT: 33 U/L (ref 0–44)
AST: 37 U/L (ref 15–41)
Albumin: 4.5 g/dL (ref 3.5–5.0)
Alkaline Phosphatase: 42 U/L (ref 38–126)
Anion gap: 9 (ref 5–15)
BUN: 15 mg/dL (ref 6–20)
CO2: 26 mmol/L (ref 22–32)
Calcium: 9.4 mg/dL (ref 8.9–10.3)
Chloride: 102 mmol/L (ref 98–111)
Creatinine, Ser: 0.82 mg/dL (ref 0.61–1.24)
GFR, Estimated: >60 mL/min (ref >60)
Glucose, Bld: 93 mg/dL (ref 70–99)
Potassium: 4.2 mmol/L (ref 3.5–5.1)
Sodium: 137 mmol/L (ref 135–145)
Total Bilirubin: 0.7 mg/dL (ref 0.0–1.2)
Total Protein: 7.1 g/dL (ref 6.5–8.1)

## 2023-09-13 LAB — CBC
HCT: 40.5 % (ref 39.0–52.0)
Hemoglobin: 13.4 g/dL (ref 13.0–17.0)
MCH: 29.6 pg (ref 26.0–34.0)
MCHC: 33.1 g/dL (ref 30.0–36.0)
MCV: 89.6 fL (ref 80.0–100.0)
Platelets: 231 10*3/uL (ref 150–400)
RBC: 4.52 MIL/uL (ref 4.22–5.81)
RDW: 12.5 % (ref 11.5–15.5)
WBC: 5.7 10*3/uL (ref 4.0–10.5)
nRBC: 0 % (ref 0.0–0.2)

## 2023-09-13 LAB — LIPASE, BLOOD: Lipase: 131 U/L — ABNORMAL HIGH (ref 11–51)

## 2023-09-13 MED ORDER — IOHEXOL 350 MG/ML SOLN
80.0000 mL | Freq: Once | INTRAVENOUS | Status: AC | PRN
  Administered 2023-09-13: 80 mL via INTRAVENOUS
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
## 2023-09-13 NOTE — Discharge Instructions (Addendum)
 Your blood work, CT scan, and urinalysis are normal.  Please follow-up with your outpatient provider.  Please return for any new, worsening, or change in symptoms or other concerns.  It was a pleasure caring for you today.

## 2023-09-13 NOTE — ED Triage Notes (Signed)
 Pt here from [facility] clinic with c/o of RLQ ABD pain that started a week ago. Pt states nausea. Pt endorse possible fever but never took temp. Pt denies sick contacts. Pt endorses increased pain after eating in RUQ area.
